# Patient Record
Sex: Female | Born: 1945 | Race: White | Hispanic: No | Marital: Married
Health system: Southern US, Community
[De-identification: ages and names within clinical notes are randomized; demographics above are authoritative.]

---

## 2019-01-28 ENCOUNTER — Other Ambulatory Visit: Payer: Self-pay

## 2019-01-28 DIAGNOSIS — Z20822 Contact with and (suspected) exposure to covid-19: Secondary | ICD-10-CM

## 2019-01-29 LAB — NOVEL CORONAVIRUS, NAA: SARS-CoV-2, NAA: NOT DETECTED

## 2019-10-02 ENCOUNTER — Ambulatory Visit (INDEPENDENT_AMBULATORY_CARE_PROVIDER_SITE_OTHER): Payer: Medicare Other | Admitting: Orthopaedic Surgery

## 2019-10-02 ENCOUNTER — Encounter: Payer: Self-pay | Admitting: Orthopaedic Surgery

## 2019-10-02 ENCOUNTER — Ambulatory Visit (INDEPENDENT_AMBULATORY_CARE_PROVIDER_SITE_OTHER): Payer: Medicare Other

## 2019-10-02 VITALS — BP 126/70 | HR 79 | Ht 68.0 in | Wt 165.0 lb

## 2019-10-02 DIAGNOSIS — M545 Low back pain, unspecified: Secondary | ICD-10-CM | POA: Insufficient documentation

## 2019-10-02 DIAGNOSIS — G8929 Other chronic pain: Secondary | ICD-10-CM | POA: Diagnosis not present

## 2019-10-02 NOTE — Addendum Note (Signed)
Addended by: Mardene Celeste B on: 10/02/2019 12:06 PM   Modules accepted: Orders

## 2019-10-02 NOTE — Progress Notes (Signed)
Office Visit Note   Patient: Kaitlyn Roberts           Date of Birth: February 22, 1946           MRN: [id number] Visit Date: 10/02/2019              Requested by: Hillery Aldo, PA-C [address] [place],  [place] [address] PCP: System, Pcp Not In   Assessment & Plan: Visit Diagnoses:  1. Chronic midline low back pain, unspecified whether sciatica present   2. Chronic left-sided low back pain without sciatica     Plan: Plain radiographs today were normal.  MRI scan images with disc as well as photocopies were reviewed.  She does not have any compression that corresponds with her back and left leg symptoms.  She may have some disc bulge intermittently.  She has increased symptoms with walking more than 3 blocks.  No problems when she swims other than getting in and out of the pool using steps.  She can use the bike and swimming for cardiovascular workout.  Should continue to walk sit and rest for a few minutes until her symptoms resolve and then walk again.  Was set up for some physical therapy evaluation treatment for back and left leg pain.  She is already had 3 epidurals last 1 did not really help.  Hopefully should get some relief with therapy and I will recheck her in 5 weeks.  Follow-Up Instructions: Return in about 5 weeks (around 11/06/2019).   Orders:  Orders Placed This Encounter  Procedures   XR Lumbar Spine 2-3 Views   No orders of the defined types were placed in this encounter.     Procedures: No procedures performed   Clinical Data: No additional findings.   Subjective: Chief Complaint  Patient presents with   Lower Back - Pain   HPI: 74 year old female lives in [place] and staying at [facility] when she visits her daughter and family for several months.  She has had an MRI scan with low back pain which has been present for a year.  She had a prednisone Dosepak at [facility] which gave her some improvement.  MRI scan is available on disc as  well as report and also photo images.  Patient had some evidence of disc degeneration with mild annular right foraminal protrusion at L4-5 but she has symptoms on the left leg not right leg.  Pain radiates from her back in her buttocks down into her lateral calf and for a while it was worse in the heel but the heel pain is gone away with the prednisone.  MRI scan did show some foraminal protrusions at L2-3 without compression of either L2 nerve root in the foramina.  5 1 level showed minimal bulge otherwise normal.  No bowel bladder symptoms no fever or chills.  She had 3 epidurals last one really did not give her any relief these were all done in [place].   Objective: Vital Signs: BP 126/70 (BP Location: Left Arm, Patient Position: Sitting, Cuff Size: Normal)    Pulse 79    Ht 5\' 8"  (1.727 m)    Wt 165 lb (74.8 kg)    BMI 25.09 kg/m   Physical Exam Constitutional:      Appearance: She is well-developed.  HENT:     Head: Normocephalic.     Right Ear: External ear normal.     Left Ear: External ear normal.  Eyes:     Pupils: Pupils are  equal, round, and reactive to light.  Neck:     Thyroid: No thyromegaly.     Trachea: No tracheal deviation.  Cardiovascular:     Rate and Rhythm: Normal rate.  Pulmonary:     Effort: Pulmonary effort is normal.  Abdominal:     Palpations: Abdomen is soft.  Skin:    General: Skin is warm and dry.  Neurological:     Mental Status: She is alert and oriented to person, place, and time.  Psychiatric:        Behavior: Behavior normal.     Ortho Exam negative straight leg raising 90 degrees mild sciatic notch tenderness left greater than right mild tenderness with palpation lumbar spine L4-S1.  Negative FABER test negative logroll to the hips.  Knees reach full extension minimal crepitus.  Anterior tib gastrocsoleus is intact she can heel and toe walk no isolated motor deficit.  Some tenderness of the peroneal nerve at the fibular head.  Negative popliteal  compression test.  Specialty Comments:  No specialty comments available.  Imaging: XR Lumbar Spine 2-3 Views  Result Date: 10/02/2019 AP lateral lumbar spine x-rays are obtained and reviewed.  This shows normal lordosis maintained disc space height negative for acute changes.  Hip joints appear normal. Impression: Normal lumbar spine images.  Comparison to 07/03/2019 MRI from [facility] medical imaging [place].    PMFS History: Patient Active Problem List   Diagnosis Date Noted   Low back pain 10/02/2019   History reviewed. No pertinent past medical history.  History reviewed. No pertinent family history.  History reviewed. No pertinent surgical history. Social History   Occupational History   Not on file  Tobacco Use   Smoking status: Not on file  Substance and Sexual Activity   Alcohol use: Not on file   Drug use: Not on file   Sexual activity: Not on file

## 2019-10-15 ENCOUNTER — Ambulatory Visit (INDEPENDENT_AMBULATORY_CARE_PROVIDER_SITE_OTHER): Payer: Medicare Other | Admitting: Physical Therapy

## 2019-10-15 ENCOUNTER — Other Ambulatory Visit: Payer: Self-pay

## 2019-10-15 ENCOUNTER — Encounter: Payer: Self-pay | Admitting: Physical Therapy

## 2019-10-15 DIAGNOSIS — M5442 Lumbago with sciatica, left side: Secondary | ICD-10-CM | POA: Diagnosis not present

## 2019-10-15 DIAGNOSIS — G8929 Other chronic pain: Secondary | ICD-10-CM | POA: Diagnosis not present

## 2019-10-15 DIAGNOSIS — M6281 Muscle weakness (generalized): Secondary | ICD-10-CM

## 2019-10-15 DIAGNOSIS — R2689 Other abnormalities of gait and mobility: Secondary | ICD-10-CM | POA: Diagnosis not present

## 2019-10-15 NOTE — Patient Instructions (Signed)
Access Code: [email or web address] URL: [email or web address] Date: 10/15/2019 Prepared by: Ivery Quale  Exercises Lying Prone with 2 Pillows - 2 x daily - 6 x weekly - 3 sets - 10 reps Supine Figure 4 Piriformis Stretch - 2 x daily - 6 x weekly - 3 reps - 1 sets - 30 hold Supine Hamstring Stretch with Strap - 2 x daily - 6 x weekly - 3 reps - 1 sets - 30 hold Supine Lower Trunk Rotation - 2 x daily - 6 x weekly - 2-3 reps - 1 sets - 10 hold Supine Bridge - 2 x daily - 6 x weekly - 10 reps - 1-2 sets - 5 hold

## 2019-10-15 NOTE — Therapy (Signed)
[facility] Physical Therapy [address] [place], [place], [address] Phone: [phone]   Fax:  [phone]  Physical Therapy Evaluation  Patient Details  Name: Kaitlyn Roberts MRN: [id number] Date of Birth: [birth year] Referring Provider (PT): Ophelia Charter, MD   Encounter Date: 10/15/2019   PT End of Session - 10/15/19 0953    Visit Number 1    Number of Visits 8    Date for PT Re-Evaluation 11/12/19    PT Start Time 0845    PT Stop Time 0935    PT Time Calculation (min) 50 min    Activity Tolerance Patient tolerated treatment well    Behavior During Therapy [facility] for tasks assessed/performed           History reviewed. No pertinent past medical history.  History reviewed. No pertinent surgical history.  There were no vitals filed for this visit.    Subjective Assessment - 10/15/19 0848    Subjective 74 year old female lives in [place] and staying at [facility] when she visits her daughter and family for several months.She will leave [facility] on 11/11/19. She presents with LBP that has become worse over the last few months, she has disc degeneration,and left leg pain with . She has increased symptoms with walking more than 3 blocks.  No problems when she swims other than getting in and out of the pool using steps.Some pain when she tries to ride back. She is already had 3 epidurals last 1 did not really help.    How long can you stand comfortably? 5 min    How long can you walk comfortably? 10 min    Diagnostic tests MRI "Patient had some evidence of disc degeneration with mild annular right foraminal protrusion at L4-5 but she has symptoms on the left leg not right leg.    Patient Stated Goals get the pain down    Currently in Pain? Yes    Pain Score 4     Pain Location Back    Pain Orientation Lower;Right;Left    Pain Descriptors / Indicators Aching;Burning    Pain Type Chronic pain    Pain Radiating Towards down Rt leg in her calf and lateral  lower leg, feels like numbness and stifness but denies tingling    Pain Onset More than a month ago    Pain Frequency Intermittent    Aggravating Factors  prolonged walking and standing, sit to stand, bike, stairs    Pain Relieving Factors heat or ice.    Effect of Pain on Daily Activities limits most ADL's              [facility] PT Assessment - 10/15/19 0001      Assessment   Medical Diagnosis chronic LBP with Lt sciatica    Referring Provider (PT) Ophelia Charter, MD    Onset Date/Surgical Date --   chronic pain but worse since May 2021   Next MD Visit 10/30/19    Prior Therapy none      Precautions   Precautions None      Restrictions   Weight Bearing Restrictions No      Balance Screen   Has the patient fallen in the past 6 months No    Has the patient had a decrease in activity level because of a fear of falling?  No    Is the patient reluctant to leave their home because of a fear of falling?  No      Home [employer]ist information centre manager residence  Prior Function   Level of Independence Independent   now needs assistance with bending, lifitng   Vocation Retired    Leisure walk, swim, bike      Cognition   Overall Cognitive Status Within Functional Limits for tasks assessed      ROM / Strength   AROM / PROM / Strength AROM;Strength      AROM   AROM Assessment Site Lumbar    Lumbar Flexion 75%   pain worse with repeated movments   Lumbar Extension 50%   pain worse with repeated movements   Lumbar - Right Side Bend WNL    Lumbar - Left Side Bend WNL    Lumbar - Right Rotation 75%    Lumbar - Left Rotation 75%      Strength   Overall Strength Comments Lt leg overall 4/5, Rt leg overall 5-      Flexibility   Soft Tissue Assessment /Muscle Length --   tight hamstrings bilat     Special Tests   Other special tests +slump test and SLR test on Lt that were negative on Rt, some relief with prone lying over 2 pillows, worse with repeated flexion      Transfers     Transfers Independent with all Transfers   more time needed due to pain     Ambulation/Gait   Gait Comments slower velocity with mild antalgic gait on Lt             Objective measurements completed on examination: See above findings.       [facility] Adult PT Treatment/Exercise - 10/15/19 0001      Exercises   Exercises Lumbar      Lumbar Exercises: Stretches   Other Lumbar Stretch Exercise prone lying over 2 pillows 3 min with some centralization of Lt leg symptoms.  Then performed for 10 minutes with heat and TENS at end      Lumbar Exercises: Aerobic   Recumbent Bike 5 min   mild pain increase   Nustep Try next visit instead of bike      Modalities   Modalities Electrical Stimulation;Moist Heat      Moist Heat Therapy   Number Minutes Moist Heat 10 Minutes    Moist Heat Location Lumbar Spine      Electrical Stimulation   Electrical Stimulation Location lumbar    Electrical Stimulation Action IFC    Electrical Stimulation Parameters tolerance in prone over 2 pillows for 10 mini    Electrical Stimulation Goals Pain                  PT Education - 10/15/19 0953    Education Details HEP, POC, exam findings    Person(s) Educated Patient    Methods Explanation;Demonstration;Verbal cues;Handout    Comprehension Verbalized understanding;Need further instruction               PT Long Term Goals - 10/15/19 1001      PT LONG TERM GOAL #1   Title Pt will be I and compliant with HEP.    Time 4    Period Weeks    Status New    Target Date 11/12/19      PT LONG TERM GOAL #2   Title Pt will improve lumbar ROM to [facility] >75%    Time 4    Period Weeks    Status New      PT LONG TERM GOAL #3   Title Pt will improve Lt leg  strength to 4+ overall tested in sitting    Time 4    Period Weeks    Status New      PT LONG TERM GOAL #4   Title Pt will report overall less than 3/10 pain and less radicular symptoms    Time 4    Period Weeks    Status New                   Plan - 10/15/19 0954    Clinical Impression Statement Pt presents with acute on chronic LBP with Lt sciatica since May 2021. Pain presented mosly as disc pathology with special testing. She is moving in 4 weeks out of [place]. She will benefit from skiled PT to address her deficits in lumbar ROM, Left leg weakness, core weakness, and increaesed pain limiting her ADL's.    Examination-Activity Limitations Bend;Carry;Squat;Stairs;Stand;Lift;Dressing;Locomotion Level;Transfers;Sit    Examination-Participation Restrictions Cleaning;Community Activity;Driving;Laundry;Shop    Stability/Clinical Decision Making Evolving/Moderate complexity    Clinical Decision Making Moderate    Rehab Potential Good    PT Frequency 2x / week    PT Duration 4 weeks    PT Treatment/Interventions ADLs/Self Care Home Management;Cryotherapy;Electrical Stimulation;Moist Heat;Traction;Ultrasound;Gait training;Therapeutic activities;Therapeutic exercise;Stair training;Balance training;Neuromuscular re-education;Manual techniques;Dry needling;Passive range of motion;Taping;Spinal Manipulations;Joint Manipulations    PT Next Visit Plan review and update HEP PRN, try nu step, consider gentle traction    PT Home Exercise Plan Access Code: [email or web address]    Consulted and Agree with Plan of Care Patient           Patient will benefit from skilled therapeutic intervention in order to improve the following deficits and impairments:  Abnormal gait, Decreased activity tolerance, Decreased range of motion, Decreased strength, Difficulty walking, Hypomobility, Impaired flexibility, Postural dysfunction, Improper body mechanics, Pain, Increased muscle spasms  Visit Diagnosis: Chronic bilateral low back pain with left-sided sciatica  Muscle weakness (generalized)  Other abnormalities of gait and mobility     Problem List Patient Active Problem List   Diagnosis Date Noted  . Low back pain 10/02/2019     Birdie Riddle 10/15/2019, 10:06 AM  [facility] Physical Therapy [address] [place], [place], [address] Phone: [phone]   Fax:  [phone]  Name: Cayley Pester MRN: [id number] Date of Birth: 01-26-1946

## 2019-10-17 ENCOUNTER — Ambulatory Visit (INDEPENDENT_AMBULATORY_CARE_PROVIDER_SITE_OTHER): Payer: Medicare Other | Admitting: Physical Therapy

## 2019-10-17 ENCOUNTER — Other Ambulatory Visit: Payer: Self-pay

## 2019-10-17 DIAGNOSIS — M5442 Lumbago with sciatica, left side: Secondary | ICD-10-CM

## 2019-10-17 DIAGNOSIS — M6281 Muscle weakness (generalized): Secondary | ICD-10-CM

## 2019-10-17 DIAGNOSIS — G8929 Other chronic pain: Secondary | ICD-10-CM | POA: Diagnosis not present

## 2019-10-17 DIAGNOSIS — R2689 Other abnormalities of gait and mobility: Secondary | ICD-10-CM | POA: Diagnosis not present

## 2019-10-17 NOTE — Therapy (Signed)
[facility] Physical Therapy [address] [place], [place], [address] Phone: [phone]   Fax:  [phone]  Physical Therapy Treatment  Patient Details  Name: Kaitlyn Roberts MRN: [id number] Date of Birth: [birth year] Referring Provider (PT): Ophelia Charter, MD   Encounter Date: 10/17/2019   PT End of Session - 10/17/19 1040    Visit Number 2    Number of Visits 8    Date for PT Re-Evaluation 11/12/19    PT Start Time 0930    PT Stop Time 1030    PT Time Calculation (min) 60 min    Activity Tolerance Patient tolerated treatment well    Behavior During Therapy [facility] for tasks assessed/performed           No past medical history on file.  No past surgical history on file.  There were no vitals filed for this visit.   Subjective Assessment - 10/17/19 0933    Subjective She relays pain is about the same, relays 6/10 LBP and Lt leg radiculopathy down to her calf. She has tried the exercises but only once so not sure if they are helping.    How long can you stand comfortably? 5 min    How long can you walk comfortably? 10 min    Diagnostic tests MRI "Patient had some evidence of disc degeneration with mild annular right foraminal protrusion at L4-5 but she has symptoms on the left leg not right leg.    Patient Stated Goals get the pain down    Pain Onset More than a month ago             [facility] Adult PT Treatment/Exercise - 10/17/19 0001      Lumbar Exercises: Stretches   Active Hamstring Stretch Left;30 seconds;3 reps    Lower Trunk Rotation 3 reps;10 seconds    Piriformis Stretch Left;3 reps;30 seconds    Other Lumbar Stretch Exercise prone lying over 2 pillows for 12 minutes with heat and TENS at end      Lumbar Exercises: Aerobic   Nustep L5 X 8 min UE/LE      Lumbar Exercises: Supine   Bridge 10 reps      Modalities   Modalities Electrical Stimulation;Moist Heat;Traction      Moist Heat Therapy   Number Minutes Moist Heat 12 Minutes    Moist  Heat Location Lumbar Spine      Electrical Stimulation   Electrical Stimulation Location lumbar    Electrical Stimulation Action IFC    Electrical Stimulation Parameters tolerance in prone over 2 pillows with heat 12 min    Electrical Stimulation Goals Pain      Traction   Type of Traction Lumbar    Min (lbs) 50    Max (lbs) 65    Hold Time 60    Rest Time 20    Time 15                       PT Long Term Goals - 10/15/19 1001      PT LONG TERM GOAL #1   Title Pt will be I and compliant with HEP.    Time 4    Period Weeks    Status New    Target Date 11/12/19      PT LONG TERM GOAL #2   Title Pt will improve lumbar ROM to [facility] >75%    Time 4    Period Weeks    Status New  PT LONG TERM GOAL #3   Title Pt will improve Lt leg strength to 4+ overall tested in sitting    Time 4    Period Weeks    Status New      PT LONG TERM GOAL #4   Title Pt will report overall less than 3/10 pain and less radicular symptoms    Time 4    Period Weeks    Status New                 Plan - 10/17/19 1042    Clinical Impression Statement Got some relief in pain from prone lying over 2 pillows with heat and TENS followed by her lumbar exercises. Then trialed mechanical traction but discontinued after 12 minutes due to worsening leg symptoms.    Examination-Activity Limitations Bend;Carry;Squat;Stairs;Stand;Lift;Dressing;Locomotion Level;Transfers;Sit    Examination-Participation Restrictions Cleaning;Community Activity;Driving;Laundry;Shop    Stability/Clinical Decision Making Evolving/Moderate complexity    Rehab Potential Good    PT Frequency 2x / week    PT Duration 4 weeks    PT Treatment/Interventions ADLs/Self Care Home Management;Cryotherapy;Electrical Stimulation;Moist Heat;Traction;Ultrasound;Gait training;Therapeutic activities;Therapeutic exercise;Stair training;Balance training;Neuromuscular re-education;Manual techniques;Dry needling;Passive range of  motion;Taping;Spinal Manipulations;Joint Manipulations    PT Next Visit Plan review and update HEP PRN, try nu step    PT Home Exercise Plan Access Code: [email or web address]    Consulted and Agree with Plan of Care Patient           Patient will benefit from skilled therapeutic intervention in order to improve the following deficits and impairments:  Abnormal gait, Decreased activity tolerance, Decreased range of motion, Decreased strength, Difficulty walking, Hypomobility, Impaired flexibility, Postural dysfunction, Improper body mechanics, Pain, Increased muscle spasms  Visit Diagnosis: Chronic bilateral low back pain with left-sided sciatica  Muscle weakness (generalized)  Other abnormalities of gait and mobility     Problem List Patient Active Problem List   Diagnosis Date Noted  . Low back pain 10/02/2019    Birdie Riddle 10/17/2019, 10:44 AM  [facility] Physical Therapy [address] [place], [place], [address] Phone: [phone]   Fax:  [phone]  Name: Analeise Mccleery MRN: [id number] Date of Birth: January 29, 1946

## 2019-10-24 ENCOUNTER — Ambulatory Visit (INDEPENDENT_AMBULATORY_CARE_PROVIDER_SITE_OTHER): Payer: Medicare Other | Admitting: Physical Therapy

## 2019-10-24 ENCOUNTER — Encounter: Payer: Self-pay | Admitting: Physical Therapy

## 2019-10-24 ENCOUNTER — Other Ambulatory Visit: Payer: Self-pay

## 2019-10-24 DIAGNOSIS — M5442 Lumbago with sciatica, left side: Secondary | ICD-10-CM | POA: Diagnosis not present

## 2019-10-24 DIAGNOSIS — M6281 Muscle weakness (generalized): Secondary | ICD-10-CM

## 2019-10-24 DIAGNOSIS — G8929 Other chronic pain: Secondary | ICD-10-CM | POA: Diagnosis not present

## 2019-10-24 DIAGNOSIS — R2689 Other abnormalities of gait and mobility: Secondary | ICD-10-CM

## 2019-10-24 NOTE — Therapy (Addendum)
[facility] Physical Therapy [address] [place], [place], [address] Phone: [phone]   Fax:  [phone]  Physical Therapy Treatment/Discharge addendum PHYSICAL THERAPY DISCHARGE SUMMARY  Visits from Start of Care: 3  Current functional level related to goals / functional outcomes: See below   Remaining deficits: See below   Education / Equipment: HEP Plan: Patient agrees to discharge.  Patient goals were partially met.                                                  the patient's request.  ?????   Patient is going out of town until November per front office staff. Elsie Ra, PT, DPT 11/05/19 8:33 AM      Patient Details  Name: Kaitlyn Roberts MRN: [id number] Date of Birth: 27-May-1945 Referring Provider (PT): Lorin Mercy, MD   Encounter Date: 10/24/2019   PT End of Session - 10/24/19 1139    Visit Number 3    Number of Visits 8    Date for PT Re-Evaluation 11/12/19    PT Start Time 1057    PT Stop Time 1138    PT Time Calculation (min) 41 min    Activity Tolerance Patient tolerated treatment well    Behavior During Therapy [facility] for tasks assessed/performed           History reviewed. No pertinent past medical history.  History reviewed. No pertinent surgical history.  There were no vitals filed for this visit.                      [place] Adult PT Treatment/Exercise - 10/24/19 1057      Self-Care   Self-Care Other Self-Care Comments    Other Self-Care Comments  instructed in home TENS application and use; established set up and parameters for pt      Lumbar Exercises: Stretches   Prone on Elbows Stretch 3 reps;60 seconds   continuous   Press Ups 10 reps;10 seconds    Press Ups Limitations to elbows    Other Lumbar Stretch Exercise Lt lateral shift correction 10x10 sec                       PT Long Term Goals - 10/15/19 1001      PT LONG TERM GOAL #1   Title Pt will be I and compliant with HEP.     Time 4    Period Weeks    Status New    Target Date 11/12/19      PT LONG TERM GOAL #2   Title Pt will improve lumbar ROM to [facility] >75%    Time 4    Period Weeks    Status New      PT LONG TERM GOAL #3   Title Pt will improve Lt leg strength to 4+ overall tested in sitting    Time 4    Period Weeks    Status New      PT LONG TERM GOAL #4   Title Pt will report overall less than 3/10 pain and less radicular symptoms    Time 4    Period Weeks    Status New                 Plan - 10/24/19 1140    Clinical Impression Statement Pt  reported no significant change in radicular pain today so trial of extension based program performed with centralization noted at end of session.  Provided for home, and pt brought home TENS unit so assisted with set up today.  Will continue to benefit from PT to maximize function.    Examination-Activity Limitations Bend;Carry;Squat;Stairs;Stand;Lift;Dressing;Locomotion Level;Transfers;Sit    Examination-Participation Restrictions Cleaning;Community Activity;Driving;Laundry;Shop    Stability/Clinical Decision Making Evolving/Moderate complexity    Rehab Potential Good    PT Frequency 2x / week    PT Duration 4 weeks    PT Treatment/Interventions ADLs/Self Care Home Management;Cryotherapy;Electrical Stimulation;Moist Heat;Traction;Ultrasound;Gait training;Therapeutic activities;Therapeutic exercise;Stair training;Balance training;Neuromuscular re-education;Manual techniques;Dry needling;Passive range of motion;Taping;Spinal Manipulations;Joint Manipulations    PT Next Visit Plan see how extension program is going, progress core/hip strength as able    PT Home Exercise Plan Access Code: [email or web address]    Consulted and Agree with Plan of Care Patient           Patient will benefit from skilled therapeutic intervention in order to improve the following deficits and impairments:  Abnormal gait, Decreased activity tolerance, Decreased range of motion,  Decreased strength, Difficulty walking, Hypomobility, Impaired flexibility, Postural dysfunction, Improper body mechanics, Pain, Increased muscle spasms  Visit Diagnosis: Chronic bilateral low back pain with left-sided sciatica  Muscle weakness (generalized)  Other abnormalities of gait and mobility     Problem List Patient Active Problem List   Diagnosis Date Noted  . Low back pain 10/02/2019      Laureen Abrahams, PT, DPT 10/24/19 11:42 AM    [facility] Physical Therapy [address] [place], [place], [address] Phone: [phone]   Fax:  [phone]  Name: Kaitlyn Roberts MRN: [id number] Date of Birth: Apr 01, 1945

## 2019-10-24 NOTE — Patient Instructions (Signed)
Access Code: [email or web address] URL: [email or web address] Date: 10/24/2019 Prepared by: Moshe Cipro  Exercises Lying Prone with 2 Pillows - 2 x daily - 6 x weekly - 3 sets - 10 reps Supine Figure 4 Piriformis Stretch - 2 x daily - 6 x weekly - 3 reps - 1 sets - 30 hold Supine Hamstring Stretch with Strap - 2 x daily - 6 x weekly - 3 reps - 1 sets - 30 hold Supine Lower Trunk Rotation - 2 x daily - 6 x weekly - 2-3 reps - 1 sets - 10 hold Supine Bridge - 2 x daily - 6 x weekly - 10 reps - 1-2 sets - 5 hold Left Standing Lateral Shift Correction at Wall - Repetitions - 3-5 x daily - 7 x weekly - 1 sets - 10 reps - 10 sec hold Prone on Elbows Stretch - 3-5 x daily - 7 x weekly - 1 sets - 1 reps - 3 min hold Prone Press Up on Elbows - 3-5 x daily - 7 x weekly - 1 sets - 10 reps - 10 sec hold

## 2019-10-29 ENCOUNTER — Encounter: Payer: Medicare Other | Admitting: Physical Therapy

## 2019-10-30 ENCOUNTER — Ambulatory Visit: Payer: Medicare Other | Admitting: Orthopaedic Surgery

## 2019-10-31 ENCOUNTER — Encounter: Payer: Medicare Other | Admitting: Physical Therapy

## 2019-11-05 ENCOUNTER — Encounter: Payer: Medicare Other | Admitting: Physical Therapy

## 2019-11-08 ENCOUNTER — Encounter: Payer: Medicare Other | Admitting: Physical Therapy

## 2020-10-13 ENCOUNTER — Emergency Department (HOSPITAL_COMMUNITY): Payer: Medicare Other

## 2020-10-13 ENCOUNTER — Other Ambulatory Visit: Payer: Self-pay

## 2020-10-13 ENCOUNTER — Emergency Department (HOSPITAL_COMMUNITY)
Admission: EM | Admit: 2020-10-13 | Discharge: 2020-10-14 | Disposition: A | Discharge status: Home / Self Care | Payer: Medicare Other | Attending: Emergency Medicine | Admitting: Emergency Medicine

## 2020-10-13 ENCOUNTER — Encounter (HOSPITAL_COMMUNITY): Payer: Self-pay

## 2020-10-13 DIAGNOSIS — R0789 Other chest pain: Secondary | ICD-10-CM

## 2020-10-13 DIAGNOSIS — Z8616 Personal history of COVID-19: Secondary | ICD-10-CM | POA: Diagnosis not present

## 2020-10-13 DIAGNOSIS — M546 Pain in thoracic spine: Secondary | ICD-10-CM | POA: Diagnosis not present

## 2020-10-13 DIAGNOSIS — R079 Chest pain, unspecified: Secondary | ICD-10-CM | POA: Insufficient documentation

## 2020-10-13 LAB — CBC
HCT: 46.1 % — ABNORMAL HIGH (ref 36.0–46.0)
Hemoglobin: 15 g/dL (ref 12.0–15.0)
MCH: 28.8 pg (ref 26.0–34.0)
MCHC: 32.5 g/dL (ref 30.0–36.0)
MCV: 88.5 fL (ref 80.0–100.0)
Platelets: 208 10*3/uL (ref 150–400)
RBC: 5.21 MIL/uL — ABNORMAL HIGH (ref 3.87–5.11)
RDW: 12.7 % (ref 11.5–15.5)
WBC: 6.9 10*3/uL (ref 4.0–10.5)
nRBC: 0 % (ref 0.0–0.2)

## 2020-10-13 LAB — TROPONIN I (HIGH SENSITIVITY)
Troponin I (High Sensitivity): 3 ng/L (ref <18)
Troponin I (High Sensitivity): 3 ng/L (ref <18)

## 2020-10-13 LAB — BASIC METABOLIC PANEL
Anion gap: 8 (ref 5–15)
BUN: 16 mg/dL (ref 8–23)
CO2: 27 mmol/L (ref 22–32)
Calcium: 9.1 mg/dL (ref 8.9–10.3)
Chloride: 103 mmol/L (ref 98–111)
Creatinine, Ser: 0.57 mg/dL (ref 0.44–1.00)
GFR, Estimated: >60 mL/min (ref >60)
Glucose, Bld: 107 mg/dL — ABNORMAL HIGH (ref 70–99)
Potassium: 3.8 mmol/L (ref 3.5–5.1)
Sodium: 138 mmol/L (ref 135–145)

## 2020-10-13 MED ORDER — ALUM & MAG HYDROXIDE-SIMETH 200-200-20 MG/5ML PO SUSP
30.0000 mL | Freq: Once | ORAL | Status: AC
  Administered 2020-10-13: 30 mL via ORAL
  Filled 2020-10-13: qty 30

## 2020-10-13 MED ORDER — DICYCLOMINE HCL 10 MG PO CAPS
10.0000 mg | ORAL_CAPSULE | Freq: Once | ORAL | Status: AC
  Administered 2020-10-13: 10 mg via ORAL
  Filled 2020-10-13: qty 1

## 2020-10-13 MED ORDER — LIDOCAINE VISCOUS HCL 2 % MT SOLN
15.0000 mL | Freq: Once | OROMUCOSAL | Status: AC
  Administered 2020-10-13: 15 mL via ORAL
  Filled 2020-10-13: qty 15

## 2020-10-13 NOTE — ED Provider Notes (Signed)
Emergency Medicine Provider Triage Evaluation Note  Kaitlyn Roberts , a 75 y.o. female  was evaluated in triage.  Pt complains of substernal chest pain with radiation to the back for 1 day.  Her chest pain has been relatively constant in nature and follows a band like distrubution. She took some Advil yesterday which seemed to improve her chest pain a little bit but it returned today which prompted her arrival.   Review of Systems  Positive: Nausea, dizziness, lightheadedness, chest pain Negative: Diaphoresis, syncope, palpitations, vomiting, leg swelling, fever, chills, frequency, dysuria, abdominal pain  Physical Exam  BP 138/67 (BP Location: Left Arm)   Pulse 72   Temp 98 F (36.7 C) (Oral)   Resp 14   Ht 5' 8.5" (1.74 m)   Wt 74.8 kg   SpO2 99%   BMI 24.72 kg/m  Gen:   Awake, no distress  Resp:  Normal effort, breath sounds are normal Heart:   Regular rate rhythm. S1 and S2 distinct. No murmurs, rubs, or gallops.  Chest wall is nontender to palpation. MSK:   Moves extremities without difficulty  Other:  No rash to the back or chest wall  Medical Decision Making  Medically screening exam initiated at 4:34 PM.  Appropriate orders placed.  Jarita Raval was informed that the remainder of the evaluation will be completed by another provider, this initial triage assessment does not replace that evaluation, and the importance of remaining in the ED until their evaluation is complete.  Chest pain   Teressa Lower, PA-C 10/13/20 1648    Derwood Kaplan, MD 10/13/20 1724

## 2020-10-13 NOTE — ED Provider Notes (Signed)
[facility]-EMERGENCY DEPT Provider Note   CSN: [id number] Arrival date & time: 10/13/20  1545     History Chief Complaint  Patient presents with   Chest Pain    Kaitlyn Roberts is a 75 y.o. 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  The patient reports a central, squeezing chest pain that began yesterday.  Pain was intermittent yesterday, but became constant around noon today.  The pain waxes and wanes in intensity and when it intensifies it feels like pressure.  Today, she also developed sharp bilateral upper back pain.  She reports that pain is worse with sitting upright.  The chest pain did intensify with eating cheese and cashews earlier today.  She has also noticed some increased pain with movement, but not with exertion.  No other known aggravating or alleviating factors.  No history of similar symptoms.  She has had some lightheadedness and nausea, but denies palpitations, near syncope, leg swelling or pain, abdominal pain, vomiting, diarrhea, shortness of breath, visual changes, diaphoresis, numbness, weakness, paresthesias fever, chills, cough, sore throat, neck pain.  She denies any recent trauma or injuries.  No history of cardiovascular disease.  She reports that her sister was diagnosed with cardiovascular disease, but she is unsure of age of onset as she has passed away.  The patient denies a history of hypertension, diabetes mellitus, peripheral vascular disease.  She is a non-smoker.  She does report that she traveled back from a [place] on July 12.  She did also recently drive from [place] to [place] and from [place] to the [employer] and back over the last month.  She also reports that she has been more stressed over the last week due to her son who lives in [place] wanting to visit when  he has been ill and she has other family events going on.  The patient was diagnosed with COVID-19 in May and subsequently developed bronchitis, but reports all of the symptoms have resolved.  The history is provided by medical records and the patient. No language interpreter was used.      History reviewed. No pertinent past medical history.  Patient Active Problem List   Diagnosis Date Noted   Low back pain 10/02/2019    History reviewed. No pertinent surgical history.   OB History   No obstetric history on file.     History reviewed. No pertinent family history.     Home Medications Prior to Admission medications   Medication Sig Start Date End Date Taking? Authorizing Provider  acetaminophen (TYLENOL) 500 MG tablet Take 500 mg by mouth every 6 (six) hours as needed.    [provider]  ibuprofen (ADVIL) 100 MG tablet Take 100 mg by mouth every 6 (six) hours as needed for fever.    [provider]    Allergies    Codeine  Review of Systems   Review of Systems  Constitutional:  Negative for activity change, chills, diaphoresis and fever.  HENT:  Negative for congestion and sore throat.   Eyes:  Negative for visual disturbance.  Respiratory:  Negative for cough, choking, chest tightness and shortness of breath.   Cardiovascular:  Positive for chest pain. Negative for palpitations and leg swelling.  Gastrointestinal:  Negative for abdominal pain, constipation, diarrhea, nausea and vomiting.  Genitourinary:  Negative for dysuria and  flank pain.  Musculoskeletal:  Positive for back pain. Negative for arthralgias, myalgias, neck pain and neck stiffness.  Skin:  Negative for color change, rash and wound.  Allergic/Immunologic: Negative for immunocompromised state.  Neurological:  Positive for light-headedness. Negative for dizziness, seizures, syncope, weakness and headaches.  Psychiatric/Behavioral:  Negative for confusion.    Physical Exam Updated  Vital Signs BP 131/63 (BP Location: Left Arm)   Pulse 64   Temp 98.2 F (36.8 C) (Oral)   Resp 16   Ht 5' 8.5" (1.74 m)   Wt 74.8 kg   SpO2 97%   BMI 24.72 kg/m   Physical Exam Vitals and nursing note reviewed.  Constitutional:      General: She is not in acute distress.    Appearance: She is not ill-appearing, toxic-appearing or diaphoretic.  HENT:     Head: Normocephalic.  Eyes:     Conjunctiva/sclera: Conjunctivae normal.  Cardiovascular:     Rate and Rhythm: Normal rate and regular rhythm.     Pulses: Normal pulses.     Heart sounds: Normal heart sounds. No murmur heard.   No friction rub. No gallop.  Pulmonary:     Effort: Pulmonary effort is normal. No respiratory distress.     Breath sounds: Normal breath sounds. No stridor. No wheezing, rhonchi or rales.     Comments: Lungs are clear to auscultation bilaterally.  No increased work of breathing. Abdominal:     General: There is no distension.     Palpations: Abdomen is soft. There is no mass.     Tenderness: There is no abdominal tenderness. There is no right CVA tenderness, left CVA tenderness, guarding or rebound.     Hernia: No hernia is present.     Comments: Abdomen is soft, nontender, nondistended.  Musculoskeletal:     Cervical back: Neck supple.     Comments: No reproducible tenderness to palpation to the spine musculature of the bilateral upper back.  Skin:    General: Skin is warm.     Findings: No rash.  Neurological:     Mental Status: She is alert.  Psychiatric:        Behavior: Behavior normal.    ED Results / Procedures / Treatments   Labs (all labs ordered are listed, but only abnormal results are displayed) Labs Reviewed  BASIC METABOLIC PANEL - Abnormal; Notable for the following components:      Result Value   Glucose, Bld 107 (*)    All other components within normal limits  CBC - Abnormal; Notable for the following components:   RBC 5.21 (*)    HCT 46.1 (*)    All other components  within normal limits  TROPONIN I (HIGH SENSITIVITY)  TROPONIN I (HIGH SENSITIVITY)    EKG EKG Interpretation  Date/Time:  Tuesday October 13 2020 16:16:24 EDT Ventricular Rate:  75 PR Interval:  131 QRS Duration: 96 QT Interval:  407 QTC Calculation: 455 R Axis:   79 Text Interpretation: Sinus rhythm 12 Lead; Mason-Likar No significant change since last tracing Confirmed by Lorre Nick ([id number]) on 10/13/2020 9:58:50 PM  Radiology DG Chest 2 View  Result Date: 10/13/2020 CLINICAL DATA:  Chest pain.  Shortness of breath. EXAM: CHEST - 2 VIEW COMPARISON:  None. FINDINGS: The cardiomediastinal contours are normal. The lungs are clear. Pulmonary vasculature is normal. No consolidation, pleural effusion, or pneumothorax. Minor endplate spurring in the thoracic spine. No acute osseous abnormalities are seen. IMPRESSION: No acute chest findings. Electronically Signed  By: Narda Rutherford M.D.   On: 10/13/2020 17:29    Procedures Procedures   Medications Ordered in ED Medications - No data to display  ED Course  I have reviewed the triage vital signs and the nursing notes.  Pertinent labs & imaging results that were available during my care of the patient were reviewed by me and considered in my medical decision making (see chart for details).    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  She has had some mild lightheadedness and nausea.  No other associated symptoms.  She does not feel as if the chest pain radiates through to the back.  States that they feel as if there are 2 independent processes.  Vital signs are stable.  Labs and imaging of been reviewed and independently interpreted by  me.  EKG was sinus rhythm.  No changes from previous.  Delta troponin is not elevated. HEAR score is 3.  CBC with no acute findings.  Metabolic panel is unremarkable.  I have a low suspicion for ACS.  She has no epigastric tenderness or right upper quadrant tenderness to suggest gastritis, cholecystitis, or pancreatitis.  Chest x-ray is unremarkable and doubt pneumonia, tension pneumothorax.  No widened mediastinum and symptoms are she is low risk for aortic dissection.  Could consider PE as the patient did fly back from [place] within the last month and has driven from an ER to [place], she has no family history and her only other risk factor is age. She has also had no signs or symptoms of a DVT.  Question GERD versus esophageal spasms.  She did report that the squeezing pain did worsen while she was eating in the emergency department.  Will give GI cocktail with Bentyl and reassess.  The patient has been seen and independently evaluated by Dr. Karolee Stamps, attending physician.  Given recent travel, PE study was obtained.  No evidence of PE.  Aorta was unremarkable.  Patient did become anxious during her ED visit and was given oral Ativan.  On reevaluation, anxiety has improved.  She does report that her symptoms are still present, but have improved.  We will treat for musculoskeletal etiology.  She is given a small dose of Toradol in the ED and will be discharged with Zanaflex.  Ice and heat, and Tylenol have been recommended.  She can follow-up with primary care if symptoms do not significantly improve with this regimen.  ER return precautions given.  She is hemodynamically stable in no acute distress.  Safe for discharged home with outpatient follow-up as discussed.  Final Clinical Impression(s) / ED Diagnoses Final diagnoses:  None    Rx / DC Orders ED Discharge Orders     None        Barkley Boards, PA-C 10/14/20 0245    Lorre Nick, MD 10/14/20 1717

## 2020-10-13 NOTE — ED Triage Notes (Signed)
Pt reports constant chest pain since yesterday that radiates to her back. Pt endorses some SHOB. Denies abdominal pain and N/V/D.

## 2020-10-14 ENCOUNTER — Encounter (HOSPITAL_COMMUNITY): Payer: Self-pay

## 2020-10-14 ENCOUNTER — Emergency Department (HOSPITAL_COMMUNITY): Payer: Medicare Other

## 2020-10-14 DIAGNOSIS — R079 Chest pain, unspecified: Secondary | ICD-10-CM | POA: Diagnosis not present

## 2020-10-14 MED ORDER — LORAZEPAM 0.5 MG PO TABS
0.5000 mg | ORAL_TABLET | Freq: Once | ORAL | Status: AC
  Administered 2020-10-14: 0.5 mg via ORAL
  Filled 2020-10-14: qty 1

## 2020-10-14 MED ORDER — TIZANIDINE HCL 2 MG PO TABS: 2.0000 mg | ORAL_TABLET | Freq: Two times a day (BID) | ORAL | 0 refills | Status: AC | PRN

## 2020-10-14 MED ORDER — IOHEXOL 350 MG/ML SOLN
100.0000 mL | Freq: Once | INTRAVENOUS | Status: AC | PRN
  Administered 2020-10-14: 100 mL via INTRAVENOUS

## 2020-10-14 MED ORDER — KETOROLAC TROMETHAMINE 30 MG/ML IJ SOLN
15.0000 mg | Freq: Once | INTRAMUSCULAR | Status: AC
  Administered 2020-10-14: 15 mg via INTRAVENOUS
  Filled 2020-10-14: qty 1

## 2020-10-14 NOTE — ED Notes (Signed)
Patient updated on plan of care.  Reassurance provided.  Lights in room dimmed for comfort

## 2020-10-14 NOTE — ED Notes (Signed)
Patient c/o feeling agitated, lightheaded, and anxious.  PA made aware.

## 2020-10-14 NOTE — Discharge Instructions (Addendum)
Thank you for allowing me to care for you today in the Emergency Department.   Take 650 mg of Tylenol once every 6 hours for pain.  You can also take 1 tablet of tizanidine once every 12 hours as needed for muscle spasms.  You can apply ice packs or heating pad for 15 to 20 minutes up to 3-4 times a day.  Try to stretch the muscles of your chest and back as your pain allows.  If your symptoms do not significantly improve with this regimen over the next week, please follow-up with your primary care provider.  Return to the emergency department if you pass out, if you develop respiratory distress, if you have severe, uncontrollable chest pain accompanied by sweating, new numbness or weakness, or other new, concerning symptoms.

## 2020-10-14 NOTE — ED Notes (Signed)
Patient ambulated  to restroom without assistance.

## 2022-04-12 ENCOUNTER — Ambulatory Visit
Admission: RE | Admit: 2022-04-12 | Discharge: 2022-04-12 | Disposition: A | Discharge status: Home / Self Care | Payer: Medicare Other | Source: Ambulatory Visit | Attending: Internal Medicine | Admitting: Internal Medicine

## 2022-04-12 ENCOUNTER — Other Ambulatory Visit: Payer: Self-pay | Admitting: Internal Medicine

## 2022-04-12 DIAGNOSIS — R059 Cough, unspecified: Secondary | ICD-10-CM

## 2022-10-30 IMAGING — CR DG CHEST 2V
2 series · 2 of 2 positions shown · non-contrast
Comparison: None.

CLINICAL DATA: Chest pain.  Shortness of breath.

EXAM:
CHEST - 2 VIEW

[w chest pa]
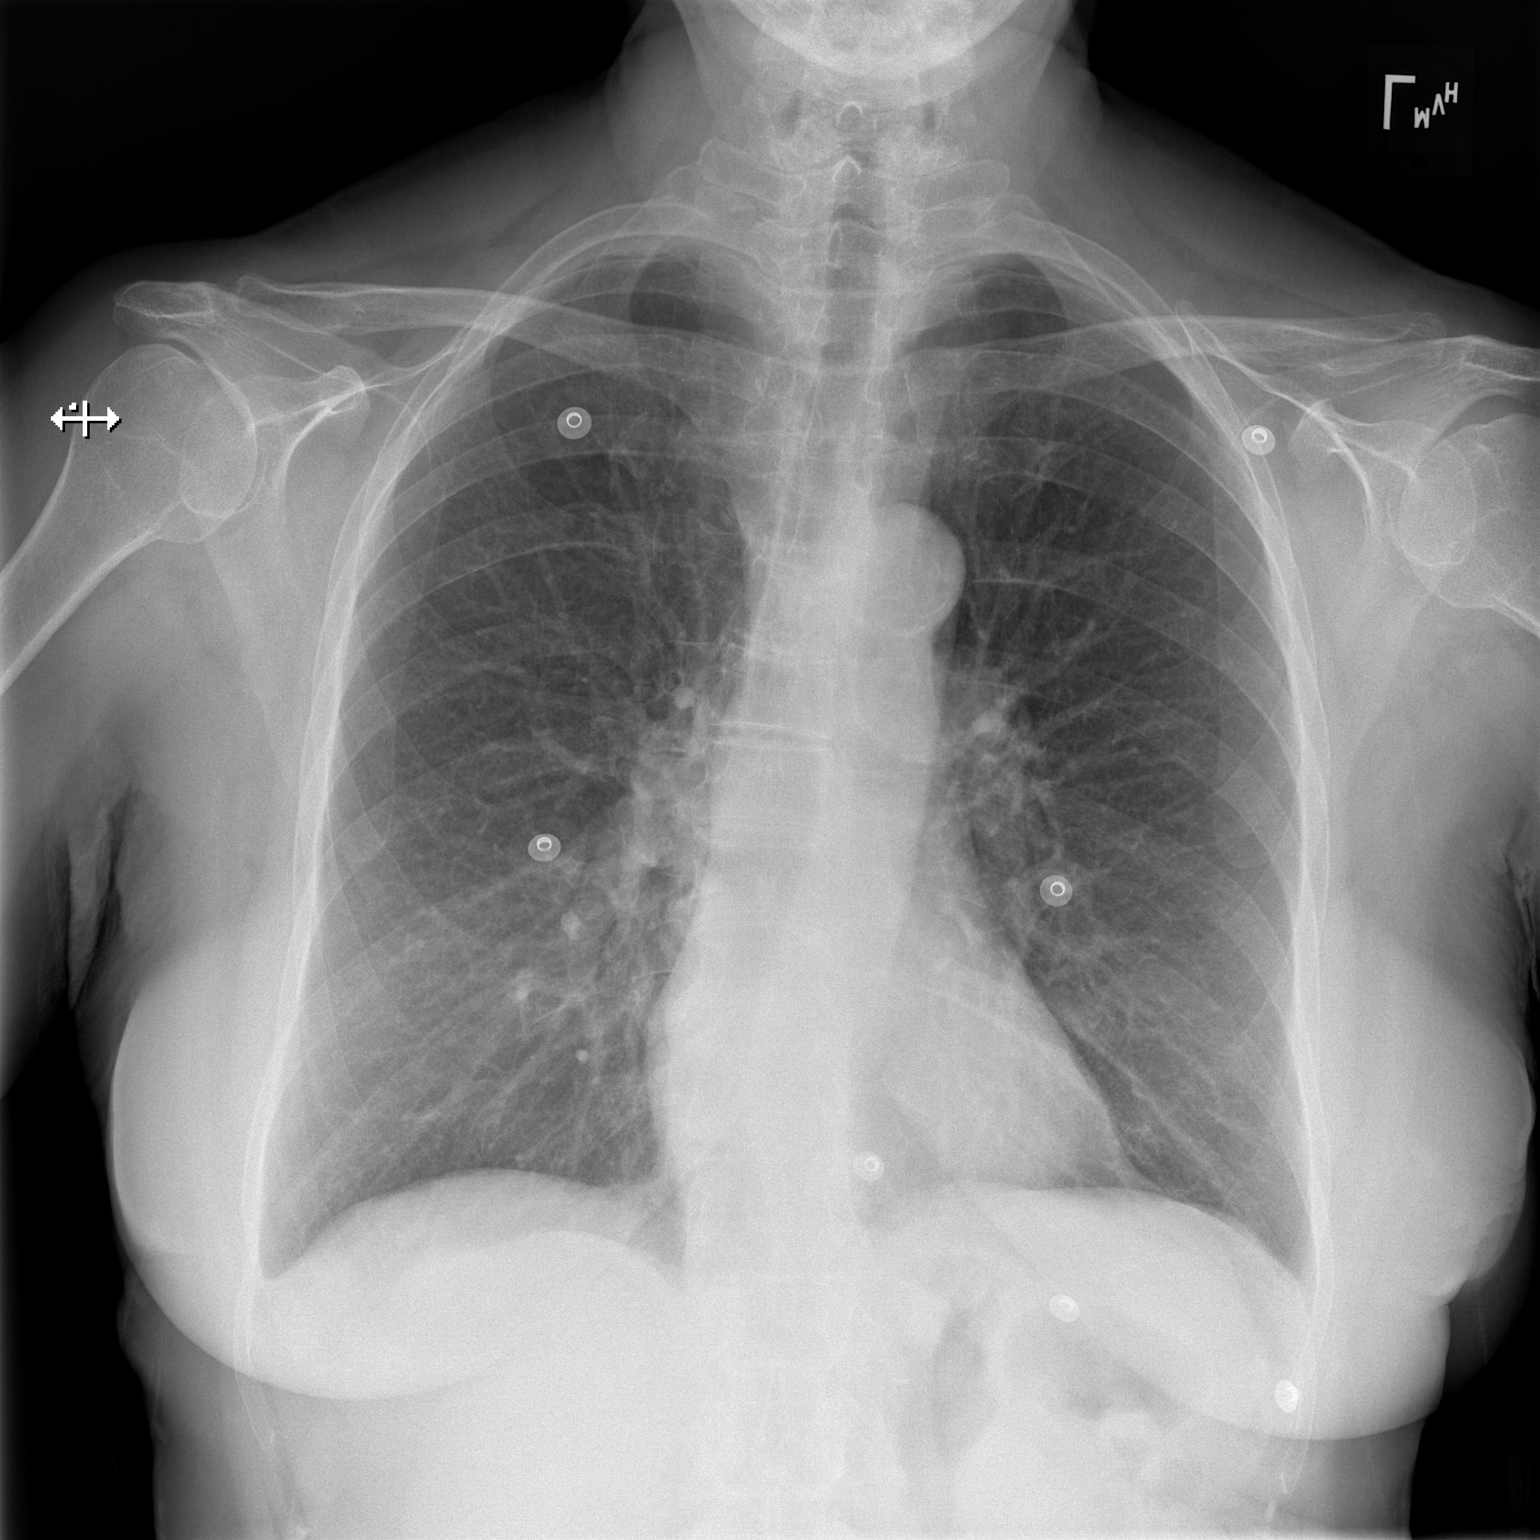

[w chest lat]
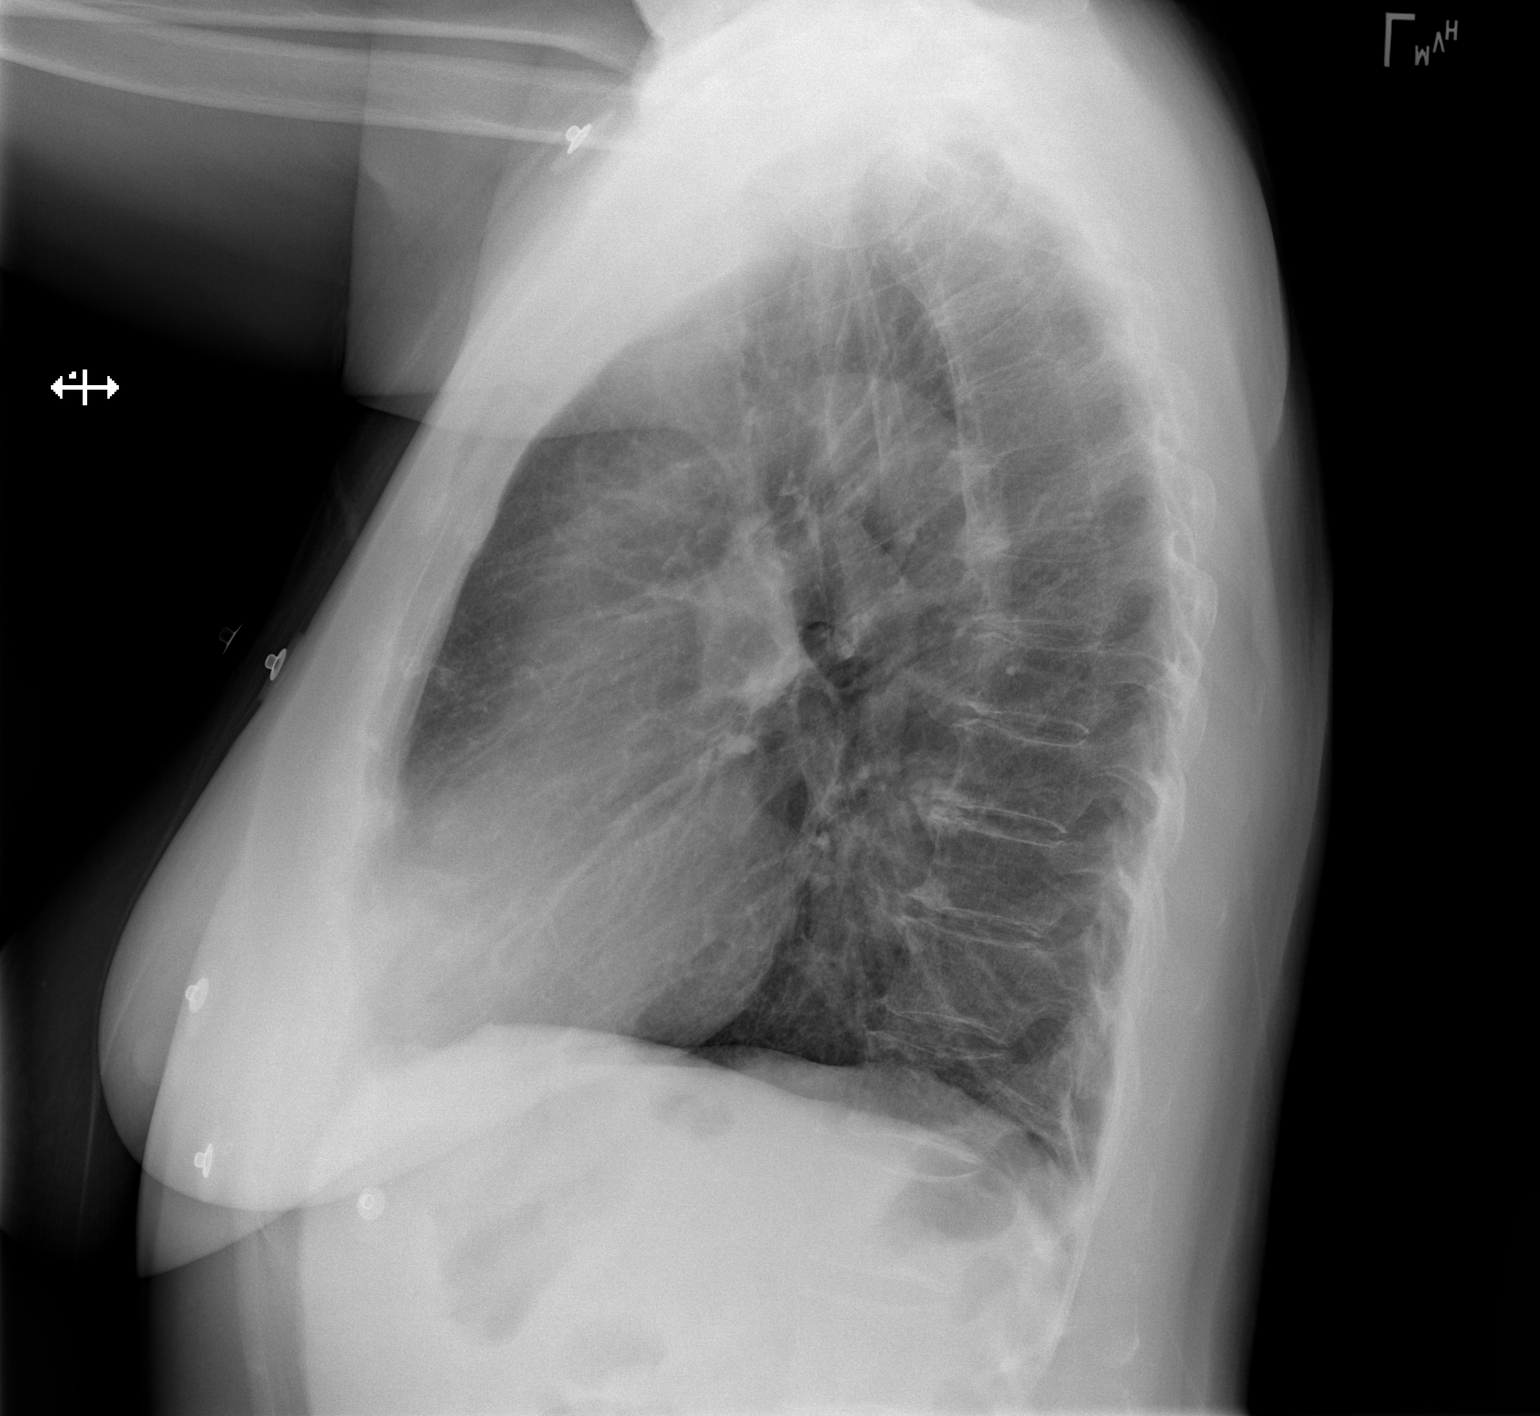

[2 of 2 positions shown; findings below may reference images not displayed]

FINDINGS: The cardiomediastinal contours are normal. The lungs are clear.
Pulmonary vasculature is normal. No consolidation, pleural effusion,
or pneumothorax. Minor endplate spurring in the thoracic spine. No
acute osseous abnormalities are seen.
IMPRESSION: No acute chest findings.
